# Patient Record
Sex: Male | Born: 1975 | Race: White | Hispanic: No | Marital: Single | Smoking: Former smoker
Health system: Southern US, Community
[De-identification: ages and names within clinical notes are randomized; demographics above are authoritative.]

## PROBLEM LIST (undated history)

## (undated) DIAGNOSIS — F419 Anxiety disorder, unspecified: Secondary | ICD-10-CM

## (undated) DIAGNOSIS — G4730 Sleep apnea, unspecified: Secondary | ICD-10-CM

## (undated) DIAGNOSIS — F32A Depression, unspecified: Secondary | ICD-10-CM

## (undated) HISTORY — PX: TONSILLECTOMY: SUR1361

## (undated) HISTORY — PX: COLONOSCOPY: SHX174

---

## 1999-05-07 HISTORY — PX: HERNIA REPAIR: SHX51

## 2004-08-06 HISTORY — PX: APPENDECTOMY: SHX54

## 2007-10-07 HISTORY — PX: EPIDIDYMIS SURGERY: SHX843

## 2018-12-28 ENCOUNTER — Other Ambulatory Visit: Payer: Self-pay

## 2018-12-28 ENCOUNTER — Emergency Department: Payer: BC Managed Care – PPO

## 2018-12-28 DIAGNOSIS — J0101 Acute recurrent maxillary sinusitis: Secondary | ICD-10-CM | POA: Insufficient documentation

## 2018-12-28 DIAGNOSIS — R55 Syncope and collapse: Secondary | ICD-10-CM | POA: Diagnosis present

## 2018-12-28 LAB — CBC WITH DIFFERENTIAL/PLATELET
Abs Immature Granulocytes: 0.02 10*3/uL (ref 0.00–0.07)
Basophils Absolute: 0.1 10*3/uL (ref 0.0–0.1)
Basophils Relative: 1 %
Eosinophils Absolute: 0.3 10*3/uL (ref 0.0–0.5)
Eosinophils Relative: 3 %
HCT: 41.2 % (ref 39.0–52.0)
Hemoglobin: 14 g/dL (ref 13.0–17.0)
Immature Granulocytes: 0 %
Lymphocytes Relative: 38 %
Lymphs Abs: 3.4 10*3/uL (ref 0.7–4.0)
MCH: 30.2 pg (ref 26.0–34.0)
MCHC: 34 g/dL (ref 30.0–36.0)
MCV: 88.8 fL (ref 80.0–100.0)
Monocytes Absolute: 0.7 10*3/uL (ref 0.1–1.0)
Monocytes Relative: 7 %
Neutro Abs: 4.6 10*3/uL (ref 1.7–7.7)
Neutrophils Relative %: 51 %
Platelets: 240 10*3/uL (ref 150–400)
RBC: 4.64 MIL/uL (ref 4.22–5.81)
RDW: 12 % (ref 11.5–15.5)
WBC: 9 10*3/uL (ref 4.0–10.5)
nRBC: 0 % (ref 0.0–0.2)

## 2018-12-28 LAB — COMPREHENSIVE METABOLIC PANEL
ALT: 13 U/L (ref 0–44)
AST: 16 U/L (ref 15–41)
Albumin: 4.4 g/dL (ref 3.5–5.0)
Alkaline Phosphatase: 51 U/L (ref 38–126)
Anion gap: 9 (ref 5–15)
BUN: 10 mg/dL (ref 6–20)
CO2: 27 mmol/L (ref 22–32)
Calcium: 9.2 mg/dL (ref 8.9–10.3)
Chloride: 104 mmol/L (ref 98–111)
Creatinine, Ser: 0.84 mg/dL (ref 0.61–1.24)
GFR calc Af Amer: >60 mL/min (ref >60)
GFR calc non Af Amer: >60 mL/min (ref >60)
Glucose, Bld: 100 mg/dL — ABNORMAL HIGH (ref 70–99)
Potassium: 3.6 mmol/L (ref 3.5–5.1)
Sodium: 140 mmol/L (ref 135–145)
Total Bilirubin: 0.8 mg/dL (ref 0.3–1.2)
Total Protein: 7.4 g/dL (ref 6.5–8.1)

## 2018-12-28 LAB — TROPONIN I (HIGH SENSITIVITY): Troponin I (High Sensitivity): 3 ng/L (ref <18)

## 2018-12-28 NOTE — ED Triage Notes (Signed)
Pt states had syncopal episode last night states has been having frequent episodes of the same in the last month. Unsure of cause, went to [facility] today but was sent over here. Denies any cp, diaphoresis, shob or nausea.

## 2018-12-29 ENCOUNTER — Emergency Department
Admission: EM | Admit: 2018-12-29 | Discharge: 2018-12-29 | Disposition: A | Discharge status: Home / Self Care | Payer: BC Managed Care – PPO | Attending: Emergency Medicine | Admitting: Emergency Medicine

## 2018-12-29 ENCOUNTER — Other Ambulatory Visit: Payer: Self-pay

## 2018-12-29 DIAGNOSIS — R55 Syncope and collapse: Secondary | ICD-10-CM

## 2018-12-29 DIAGNOSIS — J0101 Acute recurrent maxillary sinusitis: Secondary | ICD-10-CM

## 2018-12-29 LAB — URINALYSIS, COMPLETE (UACMP) WITH MICROSCOPIC
Bacteria, UA: NONE SEEN
Bilirubin Urine: NEGATIVE
Glucose, UA: NEGATIVE mg/dL
Hgb urine dipstick: NEGATIVE
Ketones, ur: NEGATIVE mg/dL
Leukocytes,Ua: NEGATIVE
Nitrite: NEGATIVE
Protein, ur: NEGATIVE mg/dL
Specific Gravity, Urine: 1.018 (ref 1.005–1.030)
pH: 5 (ref 5.0–8.0)

## 2018-12-29 MED ORDER — AMOXICILLIN-POT CLAVULANATE 875-125 MG PO TABS: 1.0000 | ORAL_TABLET | Freq: Two times a day (BID) | ORAL | 0 refills | Status: AC

## 2018-12-29 MED ORDER — AMOXICILLIN-POT CLAVULANATE 875-125 MG PO TABS
1.0000 | ORAL_TABLET | Freq: Once | ORAL | Status: AC
  Administered 2018-12-29: 1 via ORAL
  Filled 2018-12-29: qty 1

## 2018-12-29 NOTE — ED Provider Notes (Signed)
[facility] Emergency Department Provider Note   First MD Initiated Contact with Patient 12/29/18 0112     (approximate)  I have reviewed the triage vital signs and the nursing notes.   HISTORY  Chief Complaint Loss of Consciousness    HPI Jimmy Santiago is a 43 y.o. male presents to the emergency department secondary to syncopal episode which occurred last night.  Patient states that he has had multiple syncopal episodes over the past month.  Patient states only preceding symptoms is dizziness.  He states that he does not always lose consciousness however does become dizzy beforehand.  Patient denies any headache no chest pain no shortness of breath nausea vomiting or diarrhea.  Patient denies any heart palpitations.  He does state that when he regains consciousness he notes that his heart rate is rapid.  Of note the patient states that his daughter has an irregular heartbeat that sometimes results in dizziness.  Patient does not know what irregular rhythm his daughter has.        No past medical history on file.  There are no active problems to display for this patient.     Prior to Admission medications   Not on File    Allergies Patient has no known allergies.  No family history on file.  Social History Social History   Tobacco Use  . Smoking status: Not on file  Substance Use Topics  . Alcohol use: Not on file  . Drug use: Not on file    Review of Systems Constitutional: No fever/chills Eyes: No visual changes. ENT: No sore throat. Cardiovascular: Denies chest pain. Respiratory: Denies shortness of breath. Gastrointestinal: No abdominal pain.  No nausea, no vomiting.  No diarrhea.  No constipation. Genitourinary: Negative for dysuria. Musculoskeletal: Negative for neck pain.  Negative for back pain. Integumentary: Negative for rash. Neurological: Negative for headaches, focal weakness or numbness.  Positive for syncope    ____________________________________________   PHYSICAL EXAM:  VITAL SIGNS: ED Triage Vitals  Enc Vitals Group     BP 12/28/18 2130 (!) 151/89     Pulse Rate 12/28/18 2130 61     Resp 12/28/18 2130 18     Temp 12/28/18 2130 98.4 F (36.9 C)     Temp Source 12/28/18 2130 Oral     SpO2 12/28/18 2130 97 %     Weight --      Height --      Head Circumference --      Peak Flow --      Pain Score 12/28/18 2141 0     Pain Loc --      Pain Edu? --      Excl. in GC? --     Constitutional: Alert and oriented.  Eyes: Conjunctivae are normal.  Head: Atraumatic. ENT: External auditory canals clear clear fluid noted bilateral TM without any erythema or opacification Mouth/Throat: Patient is wearing a mask. Neck: No stridor.  No meningeal signs.   Cardiovascular: Normal rate, regular rhythm. Good peripheral circulation. Grossly normal heart sounds. Respiratory: Normal respiratory effort.  No retractions. Gastrointestinal: Soft and nontender. No distention.  Musculoskeletal: No lower extremity tenderness nor edema. No gross deformities of extremities. Neurologic:  Normal speech and language. No gross focal neurologic deficits are appreciated.  Skin:  Skin is warm, dry and intact. Psychiatric: Mood and affect are normal. Speech and behavior are normal.  ____________________________________________   LABS (all labs ordered are listed, but only abnormal results are displayed)  Labs Reviewed  COMPREHENSIVE METABOLIC PANEL - Abnormal; Notable for the following components:      Result Value   Glucose, Bld 100 (*)    All other components within normal limits  URINALYSIS, COMPLETE (UACMP) WITH MICROSCOPIC - Abnormal; Notable for the following components:   Color, Urine YELLOW (*)    APPearance CLEAR (*)    All other components within normal limits  CBC WITH DIFFERENTIAL/PLATELET  TROPONIN I (HIGH SENSITIVITY)   ____________________________________________  EKG  ED ECG REPORT I,  [place] N BROWN, the attending physician, personally viewed and interpreted this ECG.   Date: 12/28/2018  EKG Time: 9:25 PM  Rate: 68  Rhythm: Normal sinus rhythm  Axis: Normal  Intervals: Normal  ST&T Change: None   ED ECG REPORT I, [place] N BROWN, the attending physician, personally viewed and interpreted this ECG.   Date: 12/29/2018  EKG Time: 1:45 AM  Rate: 63  Rhythm: Normal sinus rhythm  Axis: Normal  Intervals: Normal  ST&T Change: None  ____________________________________________  RADIOLOGY I, [place] N BROWN, personally viewed and evaluated these images (plain radiographs) as part of my medical decision making, as well as reviewing the written report by the radiologist.  ED MD interpretation: Mucosal thickening and fluid within the left maxillary sinus mucosal thickening of the sphenoid and ethmoid sinuses as well on CT head per radiologist.  Official radiology report(s): Ct Head Wo Contrast  Result Date: 12/28/2018 CLINICAL DATA:  Altered LOC syncope EXAM: CT HEAD WITHOUT CONTRAST TECHNIQUE: Contiguous axial images were obtained from the base of the skull through the vertex without intravenous contrast. COMPARISON:  None. FINDINGS: Brain: No acute territorial infarction, hemorrhage, or intracranial mass. The ventricles are of normal size Vascular: No hyperdense vessel or unexpected calcification. Skull: Normal. Negative for fracture or focal lesion. Sinuses/Orbits: Mucosal thickening and fluid in the left maxillary sinus. Mucosal thickening in the sphenoid and ethmoid sinuses Other: None IMPRESSION: 1. Negative non contrasted CT appearance of the brain. 2. Sinusitis Electronically Signed   By: Donavan Foil M.D.   On: 12/28/2018 22:26     Procedures   ____________________________________________   INITIAL IMPRESSION / MDM / ASSESSMENT AND PLAN / ED COURSE  As part of my medical decision making, I reviewed the following data within the electronic medical  record:    43 year old male presenting with above-stated history and physical exam secondary to syncope.  Patient's laboratory data EKG x2 does not reveal any clear etiology for the patient's syncopal episode.  CT head revealed evidence of sinusitis which patient states that he gets "every October".  While certainly this may be the source of the patient's dizziness unsure if this is the source of the patient's syncope.  Given the patient's daughter's history of an irregular heartbeat and syncopal episode I referred the patient to cardiology for further outpatient evaluation and management.  Patient was given Augmentin in the emergency department and will be prescribed the same at home for sinusitis.  ____________________________________________  FINAL CLINICAL IMPRESSION(S) / ED DIAGNOSES  Final diagnoses:  Acute recurrent maxillary sinusitis  Syncope, unspecified syncope type     MEDICATIONS GIVEN DURING THIS VISIT:  Medications  amoxicillin-clavulanate (AUGMENTIN) 875-125 MG per tablet 1 tablet (has no administration in time range)     ED Discharge Orders    None      *Please note:  Jimmy Santiago was evaluated in Emergency Department on 12/29/2018 for the symptoms described in the history of present illness. He was evaluated in the context  of the global COVID-19 pandemic, which necessitated consideration that the patient might be at risk for infection with the SARS-CoV-2 virus that causes COVID-19. Institutional protocols and algorithms that pertain to the evaluation of patients at risk for COVID-19 are in a state of rapid change based on information released by regulatory bodies including the CDC and federal and state organizations. These policies and algorithms were followed during the patient's care in the ED.  Some ED evaluations and interventions may be delayed as a result of limited staffing during the pandemic.*  Note:  This document was prepared using Dragon voice recognition  software and may include unintentional dictation errors.   Darci CurrentBrown,  N, MD 12/29/18 [phone]

## 2020-10-01 IMAGING — CT CT HEAD W/O CM
3 series · 15 of 47 positions shown, 18 images · non-contrast
Comparison: None.

CLINICAL DATA: Altered LOC syncope

EXAM:
CT HEAD WITHOUT CONTRAST
TECHNIQUE: Contiguous axial images were obtained from the base of the skull
through the vertex without intravenous contrast.

[Series 2: head wo · axial · 0.46mm/px · z∈[+566,+696]mm · 9 of 32 slices shown, 12 images]
[im 3/32  brain]
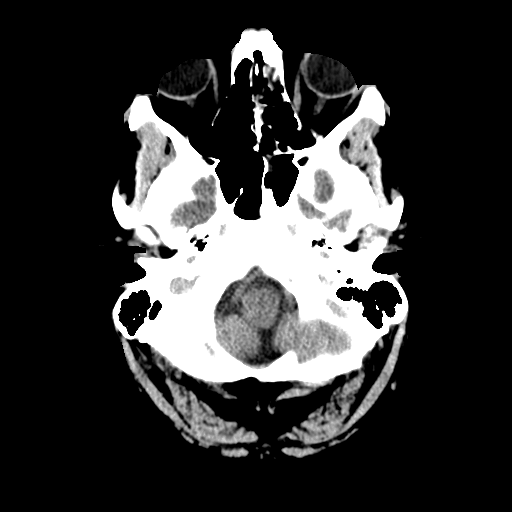
[im 3/32  bone]
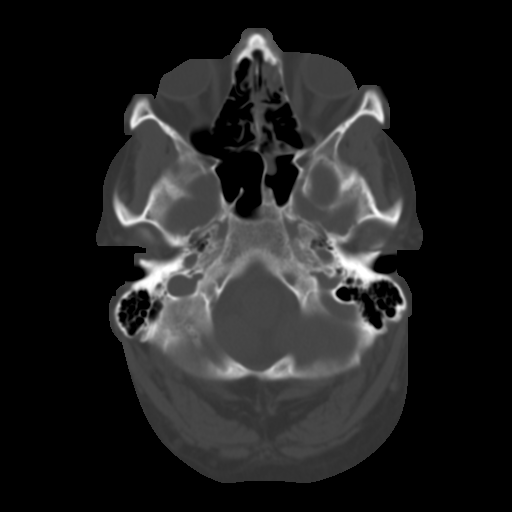
[im 6/32  brain]
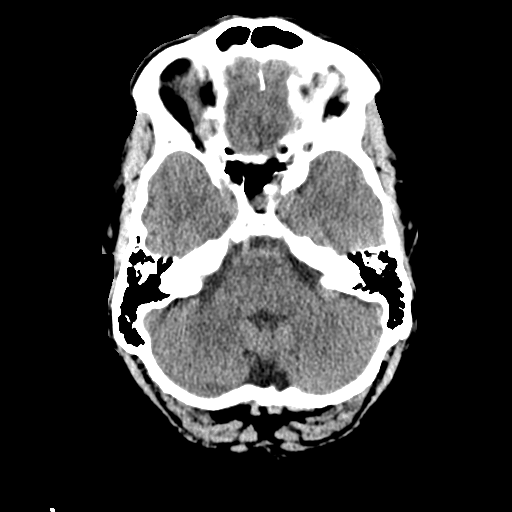
[im 9/32  brain]
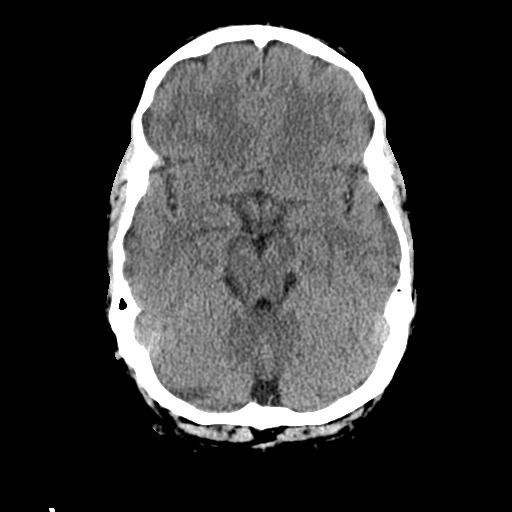
[im 12/32  brain]
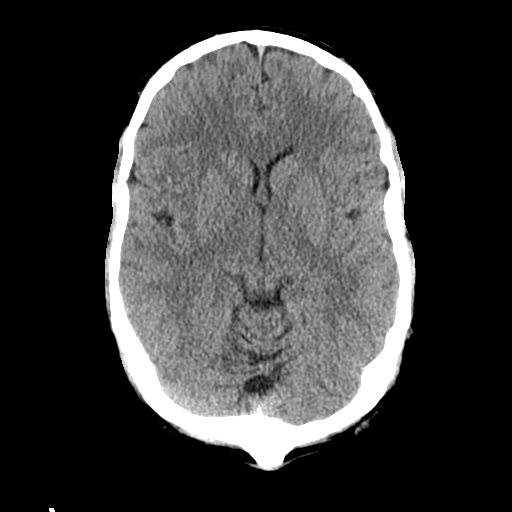
[im 17/32  brain]
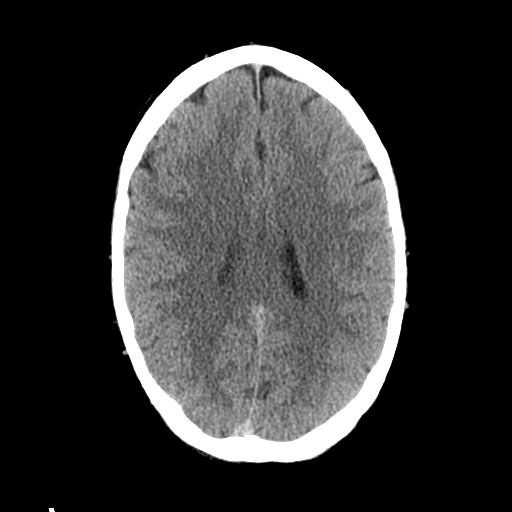
[im 17/32  bone]
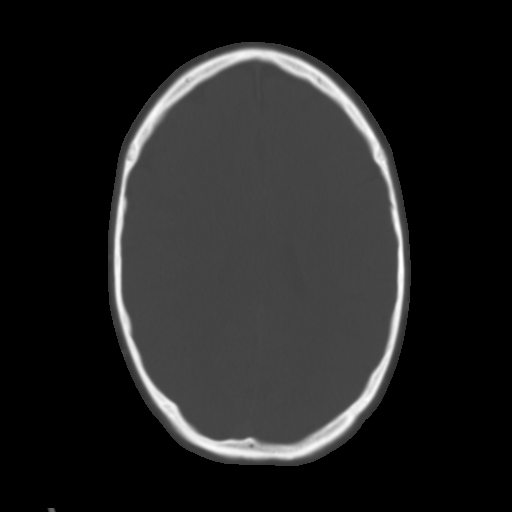
[im 20/32  brain]
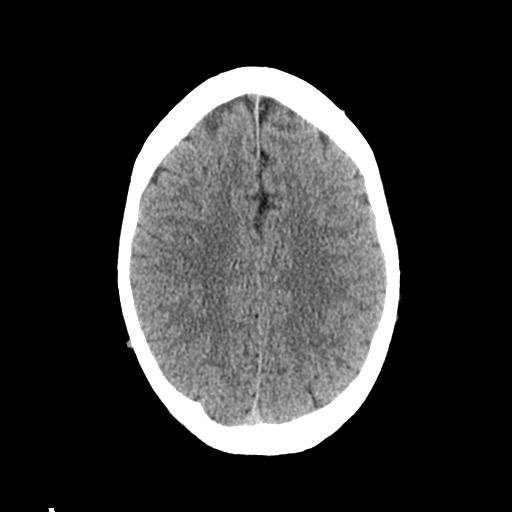
[im 23/32  brain]
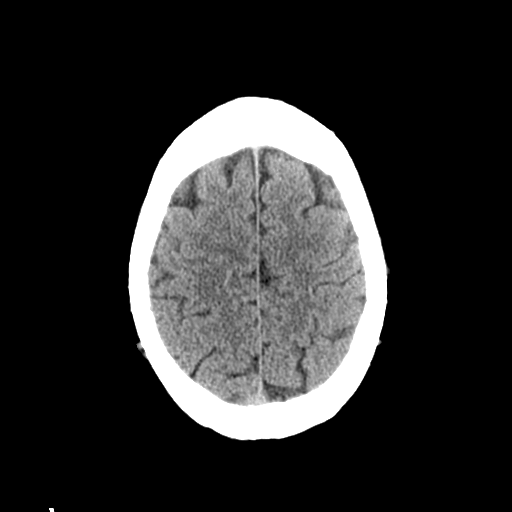
[im 26/32  brain]
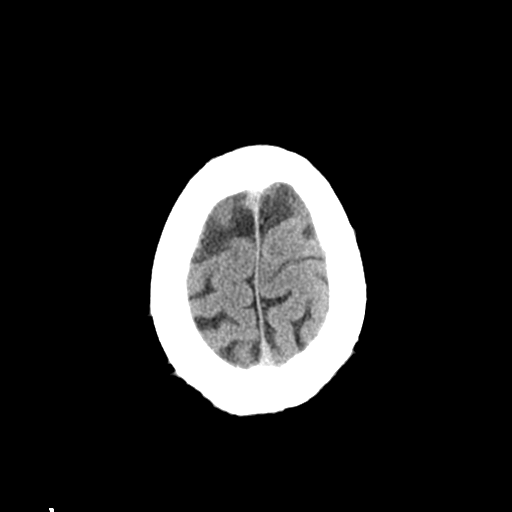
[im 29/32  brain]
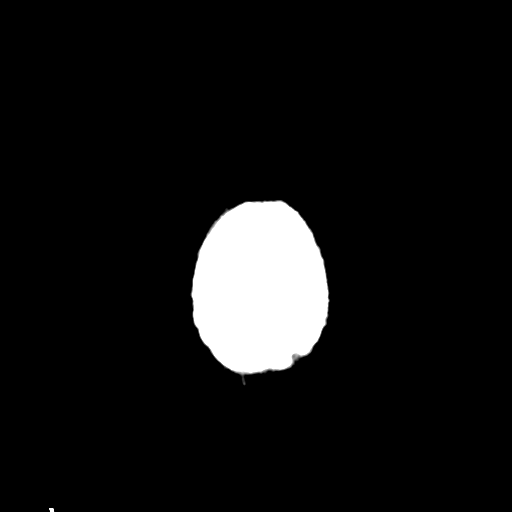
[im 29/32  bone]
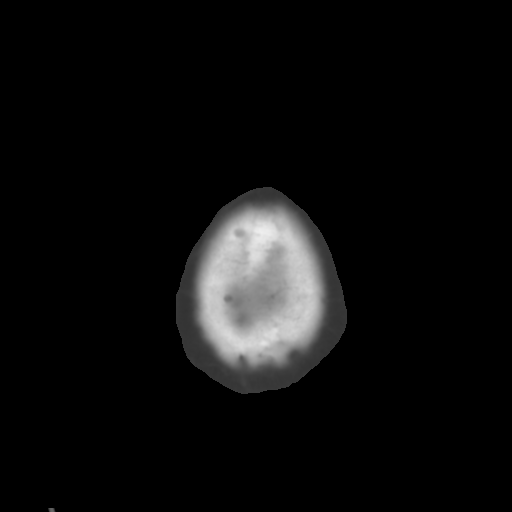

[Series 4: coronal soft tissue · coronal · 0.32mm/px · 3 of 71 slices shown]
[im 24/71  brain]
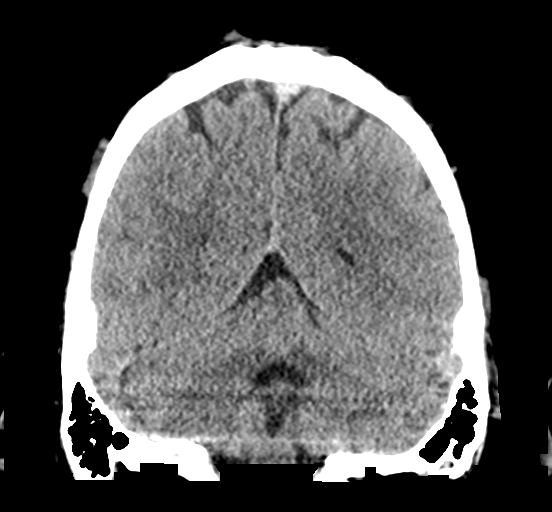
[im 32/71  brain]
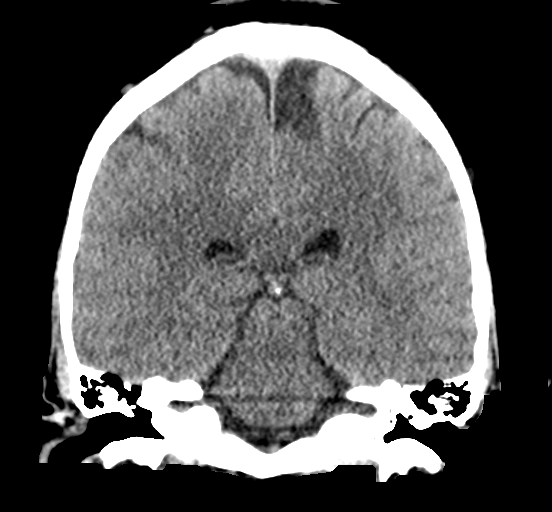
[im 39/71  brain]
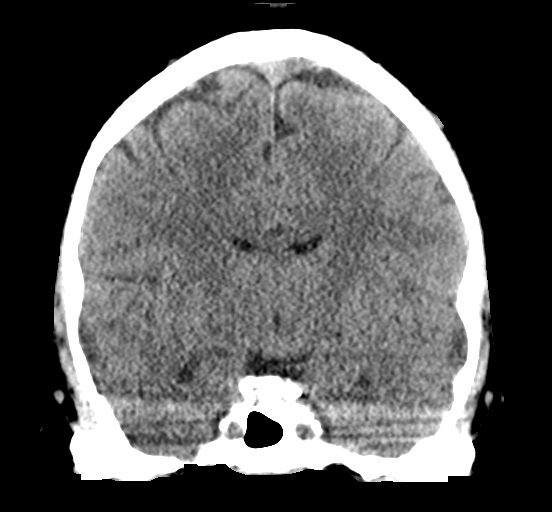

[Series 5: sagittal soft tissue · sagittal · 0.32mm/px · 3 of 53 slices shown]
[im 18/53  brain]
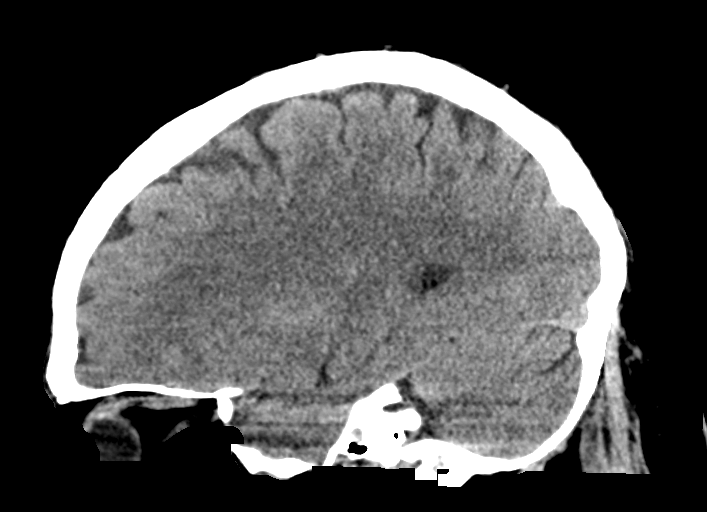
[im 27/53  brain]
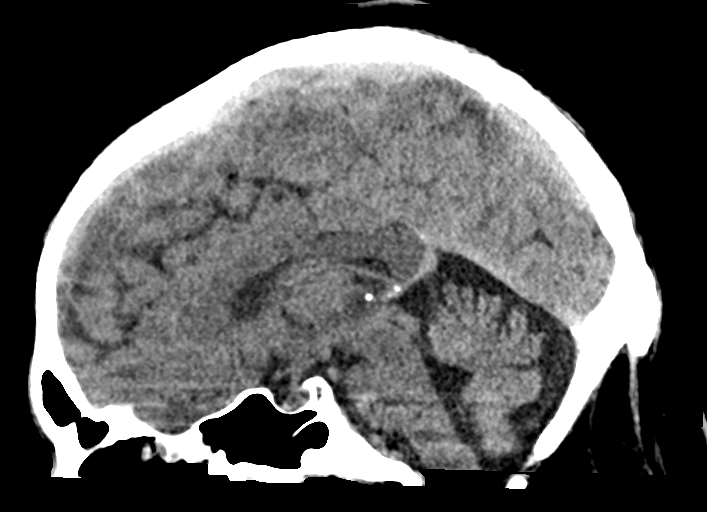
[im 35/53  brain]
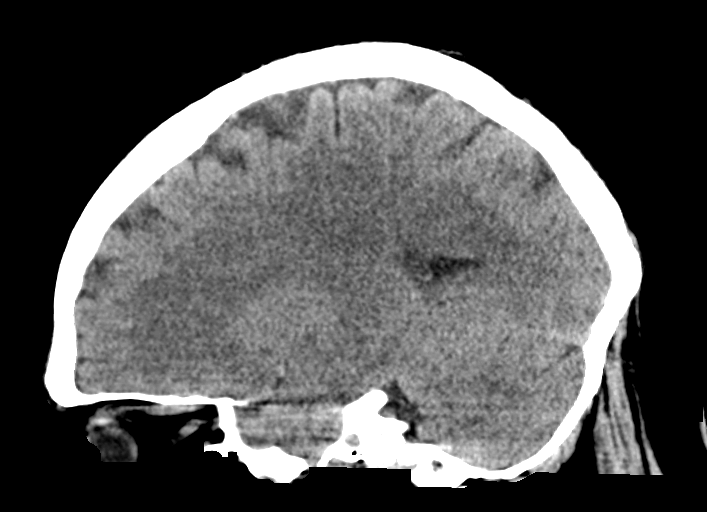

[15 of 47 positions shown; findings below may reference images not displayed]

FINDINGS: Brain: No acute territorial infarction, hemorrhage, or intracranial
mass. The ventricles are of normal size

Vascular: No hyperdense vessel or unexpected calcification.

Skull: Normal. Negative for fracture or focal lesion.

Sinuses/Orbits: Mucosal thickening and fluid in the left maxillary
sinus. Mucosal thickening in the sphenoid and ethmoid sinuses

Other: None
IMPRESSION: 1. Negative non contrasted CT appearance of the brain.
2. Sinusitis

## 2021-05-18 NOTE — Progress Notes (Incomplete)
? ?  05/18/21 ?7:54 AM  ? ?Jimmy Santiago ?August 19, 1975 ?[id number] ? ?Referring provider:  ?Jimmy Sell, MD ?[address] ?[place],  [place] [address] ?No chief complaint on file. ? ? ? ?HPI: ?Jimmy Santiago is a 46 y.o.male who presents today for further evaluation of urinary hesitancy.  ? ?He has a personal history of OSA and right kidney mass, he had epididymis removed .  ? ?He was seen by his PCP, Dr Jimmy Braun, on 04/29/2021 for urinary hesitancy that had been ongoing for a month with some changed in his urinary stream. Urinalysis was negative and he was referred to Urology.  ? ? ? ? ?PMH: ?No past medical history on file. ? ?Surgical History: ?*** The histories are not reviewed yet. Please review them in the "History" navigator section and refresh this SmartLink. ? ?Home Medications:  ?Allergies as of 05/20/2021   ?No Known Allergies ?  ? ?  ?Medication List  ?  ?as of May 18, 2021  7:54 AM ?  ?You have not been prescribed any medications. ?  ? ? ?Allergies: No Known Allergies ? ?Family History: ?No family history on file. ? ?Social History:  has no history on file for tobacco use, alcohol use, and drug use. ? ? ?Physical Exam: ?There were no vitals taken for this visit.  ?Constitutional:  Alert and oriented, No acute distress. ?HEENT: [place] AT, moist mucus membranes.  Trachea midline, no masses. ?Cardiovascular: No clubbing, cyanosis, or edema. ?Respiratory: Normal respiratory effort, no increased work of breathing. ?Skin: No rashes, bruises or suspicious lesions. ?Neurologic: Grossly intact, no focal deficits, moving all 4 extremities. ?Psychiatric: Normal mood and affect. ? ?Laboratory Data: ? ?Lab Results  ?Component Value Date  ? CREATININE 0.84 12/28/2018  ? ? ?Urinalysis ? ? ?Pertinent Imaging: ? ? ?Assessment & Plan:   ? ? ?No follow-ups on file. ? ?I,Jimmy Santiago,acting as a scribe for Jimmy Scotland, MD.,have documented all relevant documentation on the behalf of Jimmy Scotland,  MD,as directed by  Jimmy Scotland, MD while in the presence of Jimmy Scotland, MD. ? ? ?Jimmy Santiago ?[address] ?[place], [place] [address] ?[phone] ?

## 2021-05-20 ENCOUNTER — Ambulatory Visit: Payer: BC Managed Care – PPO | Admitting: Urology

## 2021-09-25 ENCOUNTER — Other Ambulatory Visit: Payer: Self-pay | Admitting: Infectious Diseases

## 2021-09-25 DIAGNOSIS — F32A Depression, unspecified: Secondary | ICD-10-CM

## 2021-09-25 DIAGNOSIS — R1031 Right lower quadrant pain: Secondary | ICD-10-CM

## 2021-10-02 ENCOUNTER — Inpatient Hospital Stay: Admission: RE | Admit: 2021-10-02 | Payer: BC Managed Care – PPO | Source: Ambulatory Visit

## 2021-10-09 ENCOUNTER — Other Ambulatory Visit: Payer: BC Managed Care – PPO

## 2022-08-31 DIAGNOSIS — R4589 Other symptoms and signs involving emotional state: Secondary | ICD-10-CM | POA: Diagnosis not present

## 2022-08-31 DIAGNOSIS — Z720 Tobacco use: Secondary | ICD-10-CM | POA: Diagnosis not present

## 2022-08-31 DIAGNOSIS — F17290 Nicotine dependence, other tobacco product, uncomplicated: Secondary | ICD-10-CM | POA: Diagnosis not present

## 2022-08-31 DIAGNOSIS — S0191XA Laceration without foreign body of unspecified part of head, initial encounter: Secondary | ICD-10-CM | POA: Diagnosis not present

## 2022-08-31 DIAGNOSIS — Z630 Problems in relationship with spouse or partner: Secondary | ICD-10-CM | POA: Diagnosis not present

## 2022-08-31 DIAGNOSIS — F332 Major depressive disorder, recurrent severe without psychotic features: Secondary | ICD-10-CM | POA: Diagnosis not present

## 2022-08-31 DIAGNOSIS — F419 Anxiety disorder, unspecified: Secondary | ICD-10-CM | POA: Diagnosis not present

## 2022-08-31 DIAGNOSIS — Z1152 Encounter for screening for COVID-19: Secondary | ICD-10-CM | POA: Diagnosis not present

## 2022-08-31 DIAGNOSIS — Z634 Disappearance and death of family member: Secondary | ICD-10-CM | POA: Diagnosis not present

## 2022-08-31 DIAGNOSIS — F32A Depression, unspecified: Secondary | ICD-10-CM | POA: Diagnosis not present

## 2022-08-31 DIAGNOSIS — F4310 Post-traumatic stress disorder, unspecified: Secondary | ICD-10-CM | POA: Diagnosis not present

## 2022-08-31 DIAGNOSIS — F1290 Cannabis use, unspecified, uncomplicated: Secondary | ICD-10-CM | POA: Diagnosis not present

## 2022-08-31 DIAGNOSIS — F909 Attention-deficit hyperactivity disorder, unspecified type: Secondary | ICD-10-CM | POA: Diagnosis not present

## 2022-08-31 DIAGNOSIS — R45851 Suicidal ideations: Secondary | ICD-10-CM | POA: Diagnosis not present

## 2022-09-01 DIAGNOSIS — Z9151 Personal history of suicidal behavior: Secondary | ICD-10-CM | POA: Diagnosis not present

## 2022-09-01 DIAGNOSIS — F4312 Post-traumatic stress disorder, chronic: Secondary | ICD-10-CM | POA: Diagnosis not present

## 2022-09-01 DIAGNOSIS — Z720 Tobacco use: Secondary | ICD-10-CM | POA: Diagnosis not present

## 2022-09-01 DIAGNOSIS — F1290 Cannabis use, unspecified, uncomplicated: Secondary | ICD-10-CM | POA: Diagnosis not present

## 2022-09-02 DIAGNOSIS — Z720 Tobacco use: Secondary | ICD-10-CM | POA: Diagnosis not present

## 2022-09-02 DIAGNOSIS — F419 Anxiety disorder, unspecified: Secondary | ICD-10-CM | POA: Diagnosis not present

## 2022-09-02 DIAGNOSIS — F1290 Cannabis use, unspecified, uncomplicated: Secondary | ICD-10-CM | POA: Diagnosis not present

## 2022-09-02 DIAGNOSIS — F4310 Post-traumatic stress disorder, unspecified: Secondary | ICD-10-CM | POA: Diagnosis not present

## 2022-09-03 DIAGNOSIS — F4310 Post-traumatic stress disorder, unspecified: Secondary | ICD-10-CM | POA: Diagnosis not present

## 2022-09-06 DIAGNOSIS — F332 Major depressive disorder, recurrent severe without psychotic features: Secondary | ICD-10-CM | POA: Diagnosis not present

## 2022-09-16 DIAGNOSIS — F332 Major depressive disorder, recurrent severe without psychotic features: Secondary | ICD-10-CM | POA: Diagnosis not present

## 2022-09-22 DIAGNOSIS — F332 Major depressive disorder, recurrent severe without psychotic features: Secondary | ICD-10-CM | POA: Diagnosis not present

## 2022-09-30 DIAGNOSIS — F332 Major depressive disorder, recurrent severe without psychotic features: Secondary | ICD-10-CM | POA: Diagnosis not present

## 2022-10-07 DIAGNOSIS — F332 Major depressive disorder, recurrent severe without psychotic features: Secondary | ICD-10-CM | POA: Diagnosis not present

## 2022-10-14 DIAGNOSIS — F332 Major depressive disorder, recurrent severe without psychotic features: Secondary | ICD-10-CM | POA: Diagnosis not present

## 2022-10-20 DIAGNOSIS — F332 Major depressive disorder, recurrent severe without psychotic features: Secondary | ICD-10-CM | POA: Diagnosis not present

## 2022-10-22 DIAGNOSIS — Z0000 Encounter for general adult medical examination without abnormal findings: Secondary | ICD-10-CM | POA: Diagnosis not present

## 2022-10-22 DIAGNOSIS — K432 Incisional hernia without obstruction or gangrene: Secondary | ICD-10-CM | POA: Diagnosis not present

## 2022-10-22 DIAGNOSIS — Z1331 Encounter for screening for depression: Secondary | ICD-10-CM | POA: Diagnosis not present

## 2022-10-22 DIAGNOSIS — F17290 Nicotine dependence, other tobacco product, uncomplicated: Secondary | ICD-10-CM | POA: Diagnosis not present

## 2022-10-22 DIAGNOSIS — Z1212 Encounter for screening for malignant neoplasm of rectum: Secondary | ICD-10-CM | POA: Diagnosis not present

## 2022-10-22 DIAGNOSIS — G4733 Obstructive sleep apnea (adult) (pediatric): Secondary | ICD-10-CM | POA: Diagnosis not present

## 2022-10-22 DIAGNOSIS — Z1211 Encounter for screening for malignant neoplasm of colon: Secondary | ICD-10-CM | POA: Diagnosis not present

## 2022-10-22 DIAGNOSIS — F32A Depression, unspecified: Secondary | ICD-10-CM | POA: Diagnosis not present

## 2022-10-28 DIAGNOSIS — F332 Major depressive disorder, recurrent severe without psychotic features: Secondary | ICD-10-CM | POA: Diagnosis not present

## 2022-11-01 ENCOUNTER — Ambulatory Visit: Payer: Self-pay | Admitting: Surgery

## 2022-11-01 DIAGNOSIS — K436 Other and unspecified ventral hernia with obstruction, without gangrene: Secondary | ICD-10-CM | POA: Diagnosis not present
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
## 2022-11-03 DIAGNOSIS — F332 Major depressive disorder, recurrent severe without psychotic features: Secondary | ICD-10-CM | POA: Diagnosis not present

## 2022-11-04 ENCOUNTER — Encounter
Admission: RE | Admit: 2022-11-04 | Discharge: 2022-11-04 | Disposition: A | Discharge status: Home / Self Care | Payer: Medicaid Other | Source: Ambulatory Visit | Attending: Surgery | Admitting: Surgery

## 2022-11-04 ENCOUNTER — Other Ambulatory Visit: Payer: Self-pay

## 2022-11-04 DIAGNOSIS — F332 Major depressive disorder, recurrent severe without psychotic features: Secondary | ICD-10-CM | POA: Diagnosis not present

## 2022-11-04 HISTORY — DX: Anxiety disorder, unspecified: F41.9

## 2022-11-04 HISTORY — DX: Sleep apnea, unspecified: G47.30

## 2022-11-04 HISTORY — DX: Depression, unspecified: F32.A

## 2022-11-04 NOTE — Patient Instructions (Addendum)
Your procedure is scheduled on: Thursday 11/11/22 To find out your arrival time, please call [phone] between 1PM - 3PM on:  Wednesday 11/10/22  Report to the Registration Desk on the 1st floor of the [facility]. FREE Valet parking is available.  If your arrival time is 6:00 am, do not arrive before that time as the [facility] entrance doors do not open until 6:00 am.  REMEMBER: Instructions that are not followed completely may result in serious medical risk, up to and including death; or upon the discretion of your surgeon and anesthesiologist your surgery may need to be rescheduled.  Do not eat food after midnight the night before surgery.  No gum chewing or hard candies.  You may however, drink CLEAR liquids up to 2 hours before you are scheduled to arrive for your surgery. Do not drink anything within 2 hours of your scheduled arrival time.  Clear liquids include: - water  - apple juice without pulp - gatorade (not RED colors) - black coffee or tea (Do NOT add milk or creamers to the coffee or tea) Do NOT drink anything that is not on this list.  Type 1 and Type 2 diabetics should only drink water.  One week prior to surgery: Stop Anti-inflammatories (NSAIDS) such as Advil, Aleve, Ibuprofen, Motrin, Naproxen, Naprosyn and Aspirin based products such as Excedrin, Goody's Powder, BC Powder. You may however, continue to take Tylenol if needed for pain up until the day of surgery.  Stop ANY OVER THE COUNTER supplements until after surgery.  Continue taking all prescribed medications.  TAKE ONLY THESE MEDICATIONS THE MORNING OF SURGERY WITH A SIP OF WATER:  sertraline (ZOLOFT) 100 MG tablet  hydrOXYzine (VISTARIL) 100 MG capsule if needed  No Alcohol for 24 hours before or after surgery.  No Smoking including e-cigarettes for 24 hours before surgery.  No chewable tobacco products for at least 6 hours before surgery.  No nicotine patches on the day of surgery.  Do not  use any "recreational" drugs for at least a week (preferably 2 weeks) before your surgery.  Please be advised that the combination of cocaine and anesthesia may have negative outcomes, up to and including death. If you test positive for cocaine, your surgery will be cancelled.  On the morning of surgery brush your teeth with toothpaste and water, you may rinse your mouth with mouthwash if you wish. Do not swallow any toothpaste or mouthwash.  Use CHG Soap or wipes as directed on instruction sheet.  Do not wear lotions, powders, or perfumes.   Do not shave body hair from the neck down 48 hours before surgery.  Wear comfortable clothing (specific to your surgery type) to the hospital.  Do not wear jewelry, make-up, hairpins, clips or nail polish.  Contact lenses, hearing aids and dentures may not be worn into surgery.  Do not bring valuables to the hospital. [facility] is not responsible for any missing/lost belongings or valuables.   Notify your doctor if there is any change in your medical condition (cold, fever, infection).  If you are being discharged the day of surgery, you will not be allowed to drive home. You will need a responsible individual to drive you home and stay with you for 24 hours after surgery.   If you are taking public transportation, you will need to have a responsible individual with you.  If you are being admitted to the hospital overnight, leave your suitcase in the car. After surgery it may  be brought to your room.  In case of increased patient census, it may be necessary for you, the patient, to continue your postoperative care in the Same Day Surgery department.  After surgery, you can help prevent lung complications by doing breathing exercises.  Take deep breaths and cough every 1-2 hours. Your doctor may order a device called an Incentive Spirometer to help you take deep breaths. When coughing or sneezing, hold a pillow firmly against your incision  with both hands. This is called "splinting." Doing this helps protect your incision. It also decreases belly discomfort.  Surgery Visitation Policy:  Patients undergoing a surgery or procedure may have two family members or support persons with them as long as the person is not COVID-19 positive or experiencing its symptoms.   Inpatient Visitation:    Visiting hours are 7 a.m. to 8 p.m. Up to four visitors are allowed at one time in a patient room. The visitors may rotate out with other people during the day. One designated support person (adult) may remain overnight.  Please call the Pre-admissions Testing Dept. at [phone] if you have any questions about these instructions.     Preparing for Surgery with CHLORHEXIDINE GLUCONATE (CHG) Soap  Chlorhexidine Gluconate (CHG) Soap  o An antiseptic cleaner that kills germs and bonds with the skin to continue killing germs even after washing  o Used for showering the night before surgery and morning of surgery  Before surgery, you can play an important role by reducing the number of germs on your skin.  CHG (Chlorhexidine gluconate) soap is an antiseptic cleanser which kills germs and bonds with the skin to continue killing germs even after washing.  Please do not use if you have an allergy to CHG or antibacterial soaps. If your skin becomes reddened/irritated stop using the CHG.  1. Shower the NIGHT BEFORE SURGERY and the MORNING OF SURGERY with CHG soap.  2. If you choose to wash your hair, wash your hair first as usual with your normal shampoo.  3. After shampooing, rinse your hair and body thoroughly to remove the shampoo.  4. Use CHG as you would any other liquid soap. You can apply CHG directly to the skin and wash gently with a scrungie or a clean washcloth.  5. Apply the CHG soap to your body only from the neck down. Do not use on open wounds or open sores. Avoid contact with your eyes, ears, mouth, and genitals (private  parts). Wash face and genitals (private parts) with your normal soap.  6. Wash thoroughly, paying special attention to the area where your surgery will be performed.  7. Thoroughly rinse your body with warm water.  8. Do not shower/wash with your normal soap after using and rinsing off the CHG soap.  9. Pat yourself dry with a clean towel.  10. Wear clean pajamas to bed the night before surgery.  12. Place clean sheets on your bed the night of your first shower and do not sleep with pets.  13. Shower again with the CHG soap on the day of surgery prior to arriving at the hospital.  14. Do not apply any deodorants/lotions/powders.  15. Please wear clean clothes to the hospital.

## 2022-11-11 ENCOUNTER — Other Ambulatory Visit: Payer: Self-pay

## 2022-11-11 ENCOUNTER — Encounter
Admission: RE | Disposition: A | Discharge status: Home / Self Care | Payer: Self-pay | Source: Ambulatory Visit | Attending: Surgery

## 2022-11-11 ENCOUNTER — Ambulatory Visit
Admission: RE | Admit: 2022-11-11 | Discharge: 2022-11-11 | Disposition: A | Discharge status: Home / Self Care | Payer: Medicaid Other | Source: Ambulatory Visit | Attending: Surgery | Admitting: Surgery

## 2022-11-11 ENCOUNTER — Encounter: Payer: Self-pay | Admitting: Surgery

## 2022-11-11 ENCOUNTER — Ambulatory Visit: Payer: Medicaid Other | Admitting: Certified Registered"

## 2022-11-11 DIAGNOSIS — K436 Other and unspecified ventral hernia with obstruction, without gangrene: Secondary | ICD-10-CM | POA: Insufficient documentation

## 2022-11-11 DIAGNOSIS — Z87891 Personal history of nicotine dependence: Secondary | ICD-10-CM | POA: Diagnosis not present

## 2022-11-11 DIAGNOSIS — G4730 Sleep apnea, unspecified: Secondary | ICD-10-CM | POA: Diagnosis not present

## 2022-11-11 HISTORY — PX: VENTRAL HERNIA REPAIR: SHX424

## 2022-11-11 SURGERY — REPAIR, HERNIA, VENTRAL
Anesthesia: General | Site: Abdomen

## 2022-11-11 MED ORDER — FENTANYL CITRATE (PF) 100 MCG/2ML IJ SOLN
INTRAMUSCULAR | Status: AC
  Filled 2022-11-11: qty 2

## 2022-11-11 MED ORDER — ACETAMINOPHEN 325 MG PO TABS: 650.0000 mg | ORAL_TABLET | Freq: Three times a day (TID) | ORAL | 0 refills | Status: AC | PRN

## 2022-11-11 MED ORDER — DOCUSATE SODIUM 100 MG PO CAPS: 100.0000 mg | ORAL_CAPSULE | Freq: Two times a day (BID) | ORAL | 0 refills | Status: AC | PRN

## 2022-11-11 MED ORDER — EPHEDRINE SULFATE (PRESSORS) 50 MG/ML IJ SOLN
INTRAMUSCULAR | Status: DC | PRN
  Administered 2022-11-11: 5 mg via INTRAVENOUS

## 2022-11-11 MED ORDER — PROPOFOL 10 MG/ML IV BOLUS
INTRAVENOUS | Status: AC
  Filled 2022-11-11: qty 20

## 2022-11-11 MED ORDER — OXYCODONE HCL 5 MG/5ML PO SOLN: 5.0000 mg | Freq: Once | ORAL | Status: DC | PRN

## 2022-11-11 MED ORDER — ACETAMINOPHEN 500 MG PO TABS
1000.0000 mg | ORAL_TABLET | ORAL | Status: AC
  Administered 2022-11-11: 1000 mg via ORAL

## 2022-11-11 MED ORDER — LACTATED RINGERS IV SOLN: INTRAVENOUS | Status: DC | PRN

## 2022-11-11 MED ORDER — DEXAMETHASONE SODIUM PHOSPHATE 10 MG/ML IJ SOLN
INTRAMUSCULAR | Status: DC | PRN
  Administered 2022-11-11: 10 mg via INTRAVENOUS

## 2022-11-11 MED ORDER — GABAPENTIN 300 MG PO CAPS
ORAL_CAPSULE | ORAL | Status: AC
  Filled 2022-11-11: qty 1

## 2022-11-11 MED ORDER — CHLORHEXIDINE GLUCONATE CLOTH 2 % EX PADS: 6.0000 | MEDICATED_PAD | Freq: Once | CUTANEOUS | Status: DC

## 2022-11-11 MED ORDER — GLYCOPYRROLATE 0.2 MG/ML IJ SOLN
INTRAMUSCULAR | Status: DC | PRN
  Administered 2022-11-11: .2 mg via INTRAVENOUS

## 2022-11-11 MED ORDER — MIDAZOLAM HCL 2 MG/2ML IJ SOLN
INTRAMUSCULAR | Status: AC
  Filled 2022-11-11: qty 2

## 2022-11-11 MED ORDER — ONDANSETRON HCL 4 MG/2ML IJ SOLN
INTRAMUSCULAR | Status: AC
  Filled 2022-11-11: qty 2

## 2022-11-11 MED ORDER — CHLORHEXIDINE GLUCONATE 0.12 % MT SOLN
15.0000 mL | Freq: Once | OROMUCOSAL | Status: AC
  Administered 2022-11-11: 15 mL via OROMUCOSAL

## 2022-11-11 MED ORDER — SUGAMMADEX SODIUM 200 MG/2ML IV SOLN
INTRAVENOUS | Status: DC | PRN
  Administered 2022-11-11: 200 mg via INTRAVENOUS

## 2022-11-11 MED ORDER — CEFAZOLIN SODIUM-DEXTROSE 2-4 GM/100ML-% IV SOLN
INTRAVENOUS | Status: AC
  Filled 2022-11-11: qty 100

## 2022-11-11 MED ORDER — CELECOXIB 200 MG PO CAPS
ORAL_CAPSULE | ORAL | Status: AC
  Filled 2022-11-11: qty 1

## 2022-11-11 MED ORDER — DEXAMETHASONE SODIUM PHOSPHATE 10 MG/ML IJ SOLN
INTRAMUSCULAR | Status: AC
  Filled 2022-11-11: qty 1

## 2022-11-11 MED ORDER — CEFAZOLIN SODIUM-DEXTROSE 2-4 GM/100ML-% IV SOLN
2.0000 g | INTRAVENOUS | Status: AC
  Administered 2022-11-11: 2 g via INTRAVENOUS

## 2022-11-11 MED ORDER — OXYCODONE-ACETAMINOPHEN 5-325 MG PO TABS
1.0000 | ORAL_TABLET | Freq: Three times a day (TID) | ORAL | 0 refills | Status: AC | PRN
Start: 2022-11-11 — End: 2023-11-11

## 2022-11-11 MED ORDER — GLYCOPYRROLATE 0.2 MG/ML IJ SOLN
INTRAMUSCULAR | Status: AC
  Filled 2022-11-11: qty 1

## 2022-11-11 MED ORDER — LACTATED RINGERS IV SOLN: INTRAVENOUS | Status: DC

## 2022-11-11 MED ORDER — BUPIVACAINE-EPINEPHRINE 0.5% -1:200000 IJ SOLN
INTRAMUSCULAR | Status: DC | PRN
  Administered 2022-11-11: 38 mL via SURGICAL_CAVITY

## 2022-11-11 MED ORDER — ORAL CARE MOUTH RINSE: 15.0000 mL | Freq: Once | OROMUCOSAL | Status: AC

## 2022-11-11 MED ORDER — OXYCODONE HCL 5 MG PO TABS: 5.0000 mg | ORAL_TABLET | Freq: Once | ORAL | Status: DC | PRN

## 2022-11-11 MED ORDER — BUPIVACAINE-EPINEPHRINE (PF) 0.5% -1:200000 IJ SOLN
INTRAMUSCULAR | Status: DC | PRN
  Administered 2022-11-11: 12 mL

## 2022-11-11 MED ORDER — FAMOTIDINE 20 MG PO TABS
20.0000 mg | ORAL_TABLET | Freq: Once | ORAL | Status: AC
  Administered 2022-11-11: 20 mg via ORAL

## 2022-11-11 MED ORDER — FAMOTIDINE 20 MG PO TABS
ORAL_TABLET | ORAL | Status: AC
  Filled 2022-11-11: qty 1

## 2022-11-11 MED ORDER — MIDAZOLAM HCL 2 MG/2ML IJ SOLN
INTRAMUSCULAR | Status: DC | PRN
  Administered 2022-11-11: 2 mg via INTRAVENOUS

## 2022-11-11 MED ORDER — 0.9 % SODIUM CHLORIDE (POUR BTL) OPTIME
TOPICAL | Status: DC | PRN
  Administered 2022-11-11: 500 mL

## 2022-11-11 MED ORDER — ACETAMINOPHEN 10 MG/ML IV SOLN
INTRAVENOUS | Status: AC
  Filled 2022-11-11: qty 100

## 2022-11-11 MED ORDER — PROPOFOL 10 MG/ML IV BOLUS
INTRAVENOUS | Status: DC | PRN
  Administered 2022-11-11: 200 mg via INTRAVENOUS

## 2022-11-11 MED ORDER — FENTANYL CITRATE (PF) 100 MCG/2ML IJ SOLN
INTRAMUSCULAR | Status: DC | PRN
  Administered 2022-11-11: 50 ug via INTRAVENOUS
  Administered 2022-11-11: 100 ug via INTRAVENOUS
  Administered 2022-11-11: 50 ug via INTRAVENOUS

## 2022-11-11 MED ORDER — BUPIVACAINE HCL (PF) 0.5 % IJ SOLN
INTRAMUSCULAR | Status: AC
  Filled 2022-11-11: qty 30

## 2022-11-11 MED ORDER — BUPIVACAINE LIPOSOME 1.3 % IJ SUSP
INTRAMUSCULAR | Status: AC
  Filled 2022-11-11: qty 20

## 2022-11-11 MED ORDER — ACETAMINOPHEN 500 MG PO TABS
ORAL_TABLET | ORAL | Status: AC
  Filled 2022-11-11: qty 2

## 2022-11-11 MED ORDER — FENTANYL CITRATE (PF) 100 MCG/2ML IJ SOLN: 25.0000 ug | INTRAMUSCULAR | Status: DC | PRN

## 2022-11-11 MED ORDER — LIDOCAINE HCL (PF) 2 % IJ SOLN
INTRAMUSCULAR | Status: AC
  Filled 2022-11-11: qty 5

## 2022-11-11 MED ORDER — IBUPROFEN 800 MG PO TABS: 800.0000 mg | ORAL_TABLET | Freq: Three times a day (TID) | ORAL | 0 refills | Status: AC | PRN

## 2022-11-11 MED ORDER — LIDOCAINE HCL (CARDIAC) PF 100 MG/5ML IV SOSY
PREFILLED_SYRINGE | INTRAVENOUS | Status: DC | PRN
  Administered 2022-11-11: 60 mg via INTRAVENOUS

## 2022-11-11 MED ORDER — CELECOXIB 200 MG PO CAPS
200.0000 mg | ORAL_CAPSULE | ORAL | Status: AC
  Administered 2022-11-11: 200 mg via ORAL

## 2022-11-11 MED ORDER — GABAPENTIN 300 MG PO CAPS
300.0000 mg | ORAL_CAPSULE | ORAL | Status: AC
  Administered 2022-11-11: 300 mg via ORAL

## 2022-11-11 MED ORDER — ROCURONIUM BROMIDE 100 MG/10ML IV SOLN
INTRAVENOUS | Status: DC | PRN
  Administered 2022-11-11: 50 mg via INTRAVENOUS
  Administered 2022-11-11: 20 mg via INTRAVENOUS

## 2022-11-11 MED ORDER — CHLORHEXIDINE GLUCONATE 0.12 % MT SOLN
OROMUCOSAL | Status: AC
  Filled 2022-11-11: qty 15

## 2022-11-11 MED ORDER — ONDANSETRON HCL 4 MG/2ML IJ SOLN
INTRAMUSCULAR | Status: DC | PRN
  Administered 2022-11-11: 4 mg via INTRAVENOUS

## 2022-11-11 MED ORDER — EPINEPHRINE PF 1 MG/ML IJ SOLN
INTRAMUSCULAR | Status: AC
  Filled 2022-11-11: qty 1

## 2022-11-11 MED ORDER — ROCURONIUM BROMIDE 10 MG/ML (PF) SYRINGE
PREFILLED_SYRINGE | INTRAVENOUS | Status: AC
  Filled 2022-11-11: qty 10

## 2022-11-11 SURGICAL SUPPLY — 35 items
ADH SKN CLS APL DERMABOND .7 (GAUZE/BANDAGES/DRESSINGS) ×1
APL PRP STRL LF DISP 70% ISPRP (MISCELLANEOUS) ×1
BLADE SURG 15 STRL LF DISP TIS (BLADE) ×1 IMPLANT
BLADE SURG 15 STRL SS (BLADE) ×1
CHLORAPREP W/TINT 26 (MISCELLANEOUS) ×1 IMPLANT
DERMABOND ADVANCED .7 DNX12 (GAUZE/BANDAGES/DRESSINGS) ×1 IMPLANT
DRAPE LAPAROTOMY 100X77 ABD (DRAPES) ×1 IMPLANT
ELECT CAUTERY BLADE 6.4 (BLADE) ×1 IMPLANT
ELECT REM PT RETURN 9FT ADLT (ELECTROSURGICAL) ×1
ELECTRODE REM PT RTRN 9FT ADLT (ELECTROSURGICAL) ×1 IMPLANT
GAUZE 4X4 16PLY ~~LOC~~+RFID DBL (SPONGE) ×1 IMPLANT
GLOVE BIOGEL PI IND STRL 7.0 (GLOVE) ×1 IMPLANT
GLOVE SURG SYN 6.5 ES PF (GLOVE) ×1 IMPLANT
GLOVE SURG SYN 6.5 PF PI (GLOVE) ×1 IMPLANT
GOWN STRL REUS W/ TWL LRG LVL3 (GOWN DISPOSABLE) ×2 IMPLANT
GOWN STRL REUS W/TWL LRG LVL3 (GOWN DISPOSABLE) ×2
KIT TURNOVER KIT A (KITS) ×1 IMPLANT
LABEL OR SOLS (LABEL) ×1 IMPLANT
MANIFOLD NEPTUNE II (INSTRUMENTS) ×1 IMPLANT
MESH VENTRALEX ST 2.5 CRC MED (Mesh General) IMPLANT
NDL HYPO 22X1.5 SAFETY MO (MISCELLANEOUS) ×2 IMPLANT
NEEDLE HYPO 22X1.5 SAFETY MO (MISCELLANEOUS) ×2 IMPLANT
NS IRRIG 500ML POUR BTL (IV SOLUTION) ×1 IMPLANT
PACK BASIN MINOR ARMC (MISCELLANEOUS) ×1 IMPLANT
SHEARS HARMONIC 9CM CVD (BLADE) IMPLANT
SUT ETHIBOND NAB MO 7 #0 18IN (SUTURE) ×1 IMPLANT
SUT MNCRL 4-0 (SUTURE) ×1
SUT MNCRL 4-0 27XMFL (SUTURE) ×1
SUT VIC AB 3-0 SH 27 (SUTURE) ×1
SUT VIC AB 3-0 SH 27X BRD (SUTURE) ×1 IMPLANT
SUTURE MNCRL 4-0 27XMF (SUTURE) ×1 IMPLANT
SYR 10ML LL (SYRINGE) ×2 IMPLANT
SYR 20ML LL LF (SYRINGE) ×1 IMPLANT
TRAP FLUID SMOKE EVACUATOR (MISCELLANEOUS) ×1 IMPLANT
WATER STERILE IRR 500ML POUR (IV SOLUTION) ×1 IMPLANT

## 2022-11-11 NOTE — Anesthesia Procedure Notes (Signed)
Procedure Name: Intubation Date/Time: 11/11/2022 1:51 PM  Performed by: Lanell Matar, CRNAPre-anesthesia Checklist: Patient identified, Emergency Drugs available, Suction available and Patient being monitored Patient Re-evaluated:Patient Re-evaluated prior to induction Oxygen Delivery Method: Circle System Utilized Preoxygenation: Pre-oxygenation with 100% oxygen Induction Type: IV induction Ventilation: Mask ventilation without difficulty, Oral airway inserted - appropriate to patient size and Two handed mask ventilation required Laryngoscope Size: McGraph and 4 Grade View: Grade I Tube type: Oral Tube size: 7.5 mm Number of attempts: 1 Airway Equipment and Method: Stylet and Oral airway Placement Confirmation: ETT inserted through vocal cords under direct vision, positive ETCO2 and breath sounds checked- equal and bilateral Secured at: 22 cm Tube secured with: Tape Dental Injury: Teeth and Oropharynx as per pre-operative assessment

## 2022-11-11 NOTE — Discharge Instructions (Addendum)
Hernia repair, Care After This sheet gives you information about how to care for yourself after your procedure. Your health care provider may also give you more specific instructions. If you have problems or questions, contact your health care provider. What can I expect after the procedure? After your procedure, it is common to have the following: Pain in your abdomen, especially in the incision areas. You will be given medicine to control the pain. Tiredness. This is a normal part of the recovery process. Your energy level will return to normal over the next several weeks. Changes in your bowel movements, such as constipation or needing to go more often. Talk with your health care provider about how to manage this. Follow these instructions at home: Medicines  tylenol and advil as needed for discomfort.  Please alternate between the two every four hours as needed for pain.    Use narcotics, if prescribed, only when tylenol and motrin is not enough to control pain.  325-650mg  every 8hrs to max of 3000mg /24hrs (including the 325mg  in every norco dose) for the tylenol.    Advil up to 800mg  per dose every 8hrs as needed for pain.   PLEASE RECORD NUMBER OF PILLS TAKEN UNTIL NEXT FOLLOW UP APPT.  THIS WILL HELP DETERMINE HOW READY YOU ARE TO BE RELEASED FROM ANY ACTIVITY RESTRICTIONS Do not drive or use heavy machinery while taking prescription pain medicine. Do not drink alcohol while taking prescription pain medicine.  Incision care    Follow instructions from your health care provider about how to take care of your incision areas. Make sure you: Keep your incisions clean and dry. Wash your hands with soap and water before and after applying medicine to the areas, and before and after changing your bandage (dressing). If soap and water are not available, use hand sanitizer. Change your dressing as told by your health care provider. Leave stitches (sutures), skin glue, or adhesive strips in  place. These skin closures may need to stay in place for 2 weeks or longer. If adhesive strip edges start to loosen and curl up, you may trim the loose edges. Do not remove adhesive strips completely unless your health care provider tells you to do that. Do not wear tight clothing over the incisions. Tight clothing may rub and irritate the incision areas, which may cause the incisions to open. Do not take baths, swim, or use a hot tub until your health care provider approves. OK TO SHOWER IN 24HRS.   Check your incision area every day for signs of infection. Check for: More redness, swelling, or pain. More fluid or blood. Warmth. Pus or a bad smell. Activity Avoid lifting anything that is heavier than 10 lb (4.5 kg) for 2 weeks or until your health care provider says it is okay. No pushing/pulling greater than 30lbs You may resume normal activities as told by your health care provider. Ask your health care provider what activities are safe for you. Take rest breaks during the day as needed. Eating and drinking Follow instructions from your health care provider about what you can eat after surgery. To prevent or treat constipation while you are taking prescription pain medicine, your health care provider may recommend that you: Drink enough fluid to keep your urine clear or pale yellow. Take over-the-counter or prescription medicines. Eat foods that are high in fiber, such as fresh fruits and vegetables, whole grains, and beans. Limit foods that are high in fat and processed sugars, such as fried and  sweet foods. General instructions Ask your health care provider when you will need an appointment to get your sutures or staples removed. Keep all follow-up visits as told by your health care provider. This is important. Contact a health care provider if: You have more redness, swelling, or pain around your incisions. You have more fluid or blood coming from the incisions. Your incisions feel  warm to the touch. You have pus or a bad smell coming from your incisions or your dressing. You have a fever. You have an incision that breaks open (edges not staying together) after sutures or staples have been removed. You develop a rash. You have chest pain or difficulty breathing. You have pain or swelling in your legs. You feel light-headed or you faint. Your abdomen swells (becomes distended). You have nausea or vomiting. You have blood in your stool (feces). This information is not intended to replace advice given to you by your health care provider. Make sure you discuss any questions you have with your health care provider. Document Released: 09/11/2004 Document Revised: 11/11/2017 Document Reviewed: 11/24/2015 Elsevier Interactive Patient Education  2019 [employer]   AMBULATORY SURGERY  DISCHARGE INSTRUCTIONS   The drugs that you were given will stay in your system until tomorrow so for the next 24 hours you should not:  Drive an automobile Make any legal decisions Drink any alcoholic beverage   You may resume regular meals tomorrow.  Today it is better to start with liquids and gradually work up to solid foods.  You may eat anything you prefer, but it is better to start with liquids, then soup and crackers, and gradually work up to solid foods.   Please notify your doctor immediately if you have any unusual bleeding, trouble breathing, redness and pain at the surgery site, drainage, fever, or pain not relieved by medication.   Your post-operative visit with Dr.                                     is: Date:                        Time:    Please call to schedule your post-operative visit.  Additional Instructions: PLEASE LEAVE TEAL BRACELET ON FOR 4 DAYS

## 2022-11-11 NOTE — Anesthesia Preprocedure Evaluation (Signed)
Anesthesia Evaluation  Patient identified by MRN, date of birth, ID band Patient awake    Reviewed: Allergy & Precautions, NPO status , Patient's Chart, lab work & pertinent test results  History of Anesthesia Complications Negative for: history of anesthetic complications  Airway Mallampati: III  TM Distance: >3 FB Neck ROM: full    Dental  (+) Chipped   Pulmonary neg shortness of breath, sleep apnea , Patient abstained from smoking., former smoker   Pulmonary exam normal        Cardiovascular Exercise Tolerance: Good (-) angina negative cardio ROS Normal cardiovascular exam     Neuro/Psych  PSYCHIATRIC DISORDERS      negative neurological ROS     GI/Hepatic negative GI ROS, Neg liver ROS,neg GERD  ,,  Endo/Other  negative endocrine ROS    Renal/GU      Musculoskeletal   Abdominal   Peds  Hematology negative hematology ROS (+)   Anesthesia Other Findings Past Medical History: No date: Anxiety No date: Depression No date: Sleep apnea  Past Surgical History: 08/2004: APPENDECTOMY No date: COLONOSCOPY 10/2007: EPIDIDYMIS SURGERY 05/1999: HERNIA REPAIR No date: TONSILLECTOMY  BMI    Body Mass Index: 28.25 kg/m      Reproductive/Obstetrics negative OB ROS                             Anesthesia Physical Anesthesia Plan  ASA: 2  Anesthesia Plan: General ETT   Post-op Pain Management:    Induction: Intravenous  PONV Risk Score and Plan: Ondansetron, Dexamethasone, Midazolam and Treatment may vary due to age or medical condition  Airway Management Planned: Oral ETT  Additional Equipment:   Intra-op Plan:   Post-operative Plan: Extubation in OR  Informed Consent: I have reviewed the patients History and Physical, chart, labs and discussed the procedure including the risks, benefits and alternatives for the proposed anesthesia with the patient or authorized  representative who has indicated his/her understanding and acceptance.     Dental Advisory Given  Plan Discussed with: Anesthesiologist, CRNA and Surgeon  Anesthesia Plan Comments: (Patient consented for risks of anesthesia including but not limited to:  - adverse reactions to medications - damage to eyes, teeth, lips or other oral mucosa - nerve damage due to positioning  - sore throat or hoarseness - Damage to heart, brain, nerves, lungs, other parts of body or loss of life  Patient voiced understanding.)       Anesthesia Quick Evaluation

## 2022-11-11 NOTE — Interval H&P Note (Signed)
No change. OK to proceed.

## 2022-11-11 NOTE — Op Note (Signed)
Preoperative diagnosis: ventral hernia, incarcerated  Postoperative diagnosis: same  Procedure:  Open incarcerated ventral hernia repair with mesh  Anesthesia: LMA  Surgeon: Sung Amabile  Wound Classification: Clean  Specimen: none  Complications: None  Estimated Blood Loss: minimal  Indications:see HPI  Findings: 2.3cm x 2.5cm ventral hernia 4. Tension free repair achieved with mesh and suture 5. Adequate hemostasis  Description of procedure: The patient was brought to the operating room and general anesthesia was induced. A time-out was completed verifying correct patient, procedure, site, positioning, and implant(s) and/or special equipment prior to beginning this procedure. Antibiotics were administered prior to making the incision. SCDs placed. The anterior abdominal wall was prepped and draped in the standard sterile fashion.   An horiztonal incision was made extending from previous scar in RUQ after infusing the preplanned incision with half percent Marcaine.  Dissection carried down to fascia where hernia sac was noted.  The omental fat contents were dissected off the surrounding structures using cautery and harmonic.  Herniated contents too large to reduce through defect, so harmonic used to transect at narrowest portion to prevent bleeding.  The stalk then ligated with 2-0 silk.  Hemostasis was confirmed prior to reducing the small amount of stalk left. Due to 2.3cm x 2.5 cm defect, decision was made to proceed with a mesh repair.    A 6.4cm umbilical mesh was placed  through the present defect and secured in place on the anterior abdominal wall using interrupted 0 Ethibond sutures.  Once the mesh was noted to be laying flat and secured to the abdominal wall the defect itself was primary closed using 0 Ethibond interrupted with taking small bites of the mesh itself.  The fascia as well as the skin incision was then infused with 20 mL's of Exparel expanded with 25 mL's of half  percent Marcaine.  the wound was irrigated and closed in a multilayer fashion, using 3-0 Vicryl for the deep dermal layer in an interrupted fashion and running 4-0 Monocryl in a subcuticular fashion.  Wound was then dressed with Dermabond.  Patient was then successfully awakened and transferred to PACU in stable condition.  At the end of the procedure sponge and instrument counts were correct

## 2022-11-11 NOTE — Anesthesia Postprocedure Evaluation (Signed)
Anesthesia Post Note  Patient: Jimmy Santiago  Procedure(s) Performed: HERNIA REPAIR VENTRAL ADULT possible w/ mesh (Abdomen)  Patient location during evaluation: PACU Anesthesia Type: General Level of consciousness: awake and alert Pain management: pain level controlled Vital Signs Assessment: post-procedure vital signs reviewed and stable Respiratory status: spontaneous breathing, nonlabored ventilation and respiratory function stable Cardiovascular status: blood pressure returned to baseline and stable Postop Assessment: no apparent nausea or vomiting Anesthetic complications: no   No notable events documented.   Last Vitals:  Vitals:   11/11/22 1600 11/11/22 1615  BP: (!) 140/94 (!) 145/96  Pulse: 79 79  Resp: 19 18  Temp: (!) 36.2 C (!) 36.4 C  SpO2: 97%     Last Pain:  Vitals:   11/11/22 1615  TempSrc: Tympanic  PainSc: 2                  Foye Deer

## 2022-11-11 NOTE — Transfer of Care (Signed)
Immediate Anesthesia Transfer of Care Note  Patient: Jimmy Santiago  Procedure(s) Performed: HERNIA REPAIR VENTRAL ADULT possible w/ mesh (Abdomen)  Patient Location: PACU  Anesthesia Type:General  Level of Consciousness: drowsy  Airway & Oxygen Therapy: Patient Spontanous Breathing  Post-op Assessment: Report given to RN and Post -op Vital signs reviewed and stable  Post vital signs: Reviewed and stable  Last Vitals:  Vitals Value Taken Time  BP 155/88 11/11/22 1525  Temp    Pulse 96 11/11/22 1529  Resp 27 11/11/22 1529  SpO2 99 % 11/11/22 1529  Vitals shown include unfiled device data.  Last Pain:  Vitals:   11/11/22 1310  TempSrc: Oral  PainSc: 0-No pain         Complications: No notable events documented.

## 2022-11-12 ENCOUNTER — Encounter: Payer: Self-pay | Admitting: Surgery

## 2022-11-15 NOTE — Group Note (Deleted)

## 2022-11-18 DIAGNOSIS — F332 Major depressive disorder, recurrent severe without psychotic features: Secondary | ICD-10-CM | POA: Diagnosis not present

## 2022-12-02 DIAGNOSIS — F332 Major depressive disorder, recurrent severe without psychotic features: Secondary | ICD-10-CM | POA: Diagnosis not present

## 2022-12-05 DIAGNOSIS — M7711 Lateral epicondylitis, right elbow: Secondary | ICD-10-CM | POA: Diagnosis not present

## 2022-12-16 DIAGNOSIS — F332 Major depressive disorder, recurrent severe without psychotic features: Secondary | ICD-10-CM | POA: Diagnosis not present

## 2022-12-30 DIAGNOSIS — F332 Major depressive disorder, recurrent severe without psychotic features: Secondary | ICD-10-CM | POA: Diagnosis not present

## 2023-01-27 DIAGNOSIS — F332 Major depressive disorder, recurrent severe without psychotic features: Secondary | ICD-10-CM | POA: Diagnosis not present

## 2023-02-15 DIAGNOSIS — F332 Major depressive disorder, recurrent severe without psychotic features: Secondary | ICD-10-CM | POA: Diagnosis not present

## 2023-03-10 DIAGNOSIS — F332 Major depressive disorder, recurrent severe without psychotic features: Secondary | ICD-10-CM | POA: Diagnosis not present

## 2023-03-16 DIAGNOSIS — S56911A Strain of unspecified muscles, fascia and tendons at forearm level, right arm, initial encounter: Secondary | ICD-10-CM | POA: Diagnosis not present

## 2023-03-16 DIAGNOSIS — S56912A Strain of unspecified muscles, fascia and tendons at forearm level, left arm, initial encounter: Secondary | ICD-10-CM | POA: Diagnosis not present

## 2023-03-17 DIAGNOSIS — F332 Major depressive disorder, recurrent severe without psychotic features: Secondary | ICD-10-CM | POA: Diagnosis not present

## 2023-03-24 DIAGNOSIS — F332 Major depressive disorder, recurrent severe without psychotic features: Secondary | ICD-10-CM | POA: Diagnosis not present

## 2023-03-25 DIAGNOSIS — M7711 Lateral epicondylitis, right elbow: Secondary | ICD-10-CM | POA: Diagnosis not present

## 2023-03-25 DIAGNOSIS — M7712 Lateral epicondylitis, left elbow: Secondary | ICD-10-CM | POA: Diagnosis not present

## 2023-03-31 DIAGNOSIS — F332 Major depressive disorder, recurrent severe without psychotic features: Secondary | ICD-10-CM | POA: Diagnosis not present

## 2023-04-14 DIAGNOSIS — F332 Major depressive disorder, recurrent severe without psychotic features: Secondary | ICD-10-CM | POA: Diagnosis not present

## 2023-04-18 DIAGNOSIS — F332 Major depressive disorder, recurrent severe without psychotic features: Secondary | ICD-10-CM | POA: Diagnosis not present

## 2023-04-21 DIAGNOSIS — F332 Major depressive disorder, recurrent severe without psychotic features: Secondary | ICD-10-CM | POA: Diagnosis not present

## 2023-04-28 DIAGNOSIS — F332 Major depressive disorder, recurrent severe without psychotic features: Secondary | ICD-10-CM | POA: Diagnosis not present

## 2023-05-03 DIAGNOSIS — F332 Major depressive disorder, recurrent severe without psychotic features: Secondary | ICD-10-CM | POA: Diagnosis not present

## 2023-05-05 DIAGNOSIS — F332 Major depressive disorder, recurrent severe without psychotic features: Secondary | ICD-10-CM | POA: Diagnosis not present

## 2023-05-17 DIAGNOSIS — F332 Major depressive disorder, recurrent severe without psychotic features: Secondary | ICD-10-CM | POA: Diagnosis not present

## 2023-05-31 DIAGNOSIS — F332 Major depressive disorder, recurrent severe without psychotic features: Secondary | ICD-10-CM | POA: Diagnosis not present

## 2023-06-16 DIAGNOSIS — F332 Major depressive disorder, recurrent severe without psychotic features: Secondary | ICD-10-CM | POA: Diagnosis not present

## 2023-07-13 DIAGNOSIS — F332 Major depressive disorder, recurrent severe without psychotic features: Secondary | ICD-10-CM | POA: Diagnosis not present

## 2023-07-14 DIAGNOSIS — F4312 Post-traumatic stress disorder, chronic: Secondary | ICD-10-CM | POA: Diagnosis not present

## 2023-10-17 DIAGNOSIS — F250 Schizoaffective disorder, bipolar type: Secondary | ICD-10-CM | POA: Diagnosis not present

## 2023-10-17 DIAGNOSIS — F4312 Post-traumatic stress disorder, chronic: Secondary | ICD-10-CM | POA: Diagnosis not present

## 2023-10-21 DIAGNOSIS — F250 Schizoaffective disorder, bipolar type: Secondary | ICD-10-CM | POA: Diagnosis not present

## 2023-10-27 DIAGNOSIS — F250 Schizoaffective disorder, bipolar type: Secondary | ICD-10-CM | POA: Diagnosis not present

## 2023-11-01 DIAGNOSIS — F4312 Post-traumatic stress disorder, chronic: Secondary | ICD-10-CM | POA: Diagnosis not present

## 2023-11-01 DIAGNOSIS — F250 Schizoaffective disorder, bipolar type: Secondary | ICD-10-CM | POA: Diagnosis not present

## 2023-11-03 DIAGNOSIS — F250 Schizoaffective disorder, bipolar type: Secondary | ICD-10-CM | POA: Diagnosis not present

## 2023-11-10 DIAGNOSIS — F250 Schizoaffective disorder, bipolar type: Secondary | ICD-10-CM | POA: Diagnosis not present

## 2023-11-18 DIAGNOSIS — F250 Schizoaffective disorder, bipolar type: Secondary | ICD-10-CM | POA: Diagnosis not present

## 2023-11-24 DIAGNOSIS — F250 Schizoaffective disorder, bipolar type: Secondary | ICD-10-CM | POA: Diagnosis not present

## 2023-12-02 DIAGNOSIS — F250 Schizoaffective disorder, bipolar type: Secondary | ICD-10-CM | POA: Diagnosis not present

## 2023-12-15 DIAGNOSIS — F250 Schizoaffective disorder, bipolar type: Secondary | ICD-10-CM | POA: Diagnosis not present

## 2023-12-29 DIAGNOSIS — M7712 Lateral epicondylitis, left elbow: Secondary | ICD-10-CM | POA: Diagnosis not present

## 2023-12-29 DIAGNOSIS — F250 Schizoaffective disorder, bipolar type: Secondary | ICD-10-CM | POA: Diagnosis not present

## 2023-12-29 DIAGNOSIS — M7711 Lateral epicondylitis, right elbow: Secondary | ICD-10-CM | POA: Diagnosis not present
